# Patient Record
Sex: Male | Born: 2000 | Race: Asian | Hispanic: No | Marital: Single | Smoking: Never smoker
Health system: Southern US, Community
[De-identification: ages and names within clinical notes are randomized; demographics above are authoritative.]

## PROBLEM LIST (undated history)

## (undated) DIAGNOSIS — J45909 Unspecified asthma, uncomplicated: Secondary | ICD-10-CM

## (undated) DIAGNOSIS — J302 Other seasonal allergic rhinitis: Secondary | ICD-10-CM

## (undated) DIAGNOSIS — Z91018 Allergy to other foods: Secondary | ICD-10-CM

## (undated) HISTORY — DX: Allergy to other foods: Z91.018

## (undated) HISTORY — DX: Unspecified asthma, uncomplicated: J45.909

---
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
## 2020-01-21 ENCOUNTER — Ambulatory Visit: Payer: Medicaid Other | Attending: Internal Medicine

## 2020-01-21 DIAGNOSIS — Z23 Encounter for immunization: Secondary | ICD-10-CM | POA: Insufficient documentation

## 2020-01-21 NOTE — Progress Notes (Signed)
   Covid-19 Vaccination Clinic  Name:  Jose Colon    MRN: [id number] DOB: [birth year]  01/21/2020  [name] was observed post Covid-19 immunization for 15 minutes without incident. He was provided with Vaccine Information Sheet and instruction to access the V-Safe system.   [name] was instructed to call 911 with any severe reactions post vaccine: Marland Kitchen Difficulty breathing  . Swelling of face and throat  . A fast heartbeat  . A bad rash all over body  . Dizziness and weakness   Immunizations Administered    Name Date Dose VIS Date Route   Pfizer COVID-19 Vaccine 01/21/2020 11:42 AM 0.3 mL 10/28/2019 Intramuscular   Manufacturer: [employer], [employer]   Lot: [id number]   NDC: [address]-8

## 2020-02-11 ENCOUNTER — Ambulatory Visit: Payer: Medicaid Other | Attending: Internal Medicine

## 2020-02-11 DIAGNOSIS — Z23 Encounter for immunization: Secondary | ICD-10-CM

## 2020-02-11 NOTE — Progress Notes (Signed)
   Covid-19 Vaccination Clinic  Name:  [name]    MRN: [id number] DOB: [birth year]  02/11/2020  [name] was observed post Covid-19 immunization for 15 minutes without incident. He was provided with Vaccine Information Sheet and instruction to access the V-Safe system.   [name] was instructed to call 911 with any severe reactions post vaccine: Marland Kitchen Difficulty breathing  . Swelling of face and throat  . A fast heartbeat  . A bad rash all over body  . Dizziness and weakness   Immunizations Administered    Name Date Dose VIS Date Route   Pfizer COVID-19 Vaccine 02/11/2020 11:11 AM 0.3 mL 10/28/2019 Intramuscular   Manufacturer: [employer], [employer]   Lot: [id number]   NDC: [address]-1

## 2020-02-13 ENCOUNTER — Ambulatory Visit: Payer: Medicaid Other

## 2020-02-22 ENCOUNTER — Ambulatory Visit: Payer: Medicaid Other
